# Patient Record
Sex: Female | Born: 1975 | Race: Black or African American | Hispanic: No | Marital: Single | Smoking: Never smoker
Health system: Southern US, Community
[De-identification: ages and names within clinical notes are randomized; demographics above are authoritative.]

## PROBLEM LIST (undated history)

## (undated) DIAGNOSIS — I10 Essential (primary) hypertension: Secondary | ICD-10-CM

## (undated) HISTORY — PX: TONSILLECTOMY: SUR1361

## (undated) HISTORY — PX: LUNG SURGERY: SHX703

---

## 1998-04-22 ENCOUNTER — Other Ambulatory Visit: Admission: RE | Admit: 1998-04-22 | Discharge: 1998-04-22 | Payer: Self-pay | Admitting: Obstetrics

## 2000-03-07 ENCOUNTER — Inpatient Hospital Stay (HOSPITAL_COMMUNITY): Admission: AD | Admit: 2000-03-07 | Discharge: 2000-03-07 | Payer: Self-pay | Admitting: *Deleted

## 2000-06-05 ENCOUNTER — Emergency Department (HOSPITAL_COMMUNITY): Admission: EM | Admit: 2000-06-05 | Discharge: 2000-06-05 | Payer: Self-pay | Admitting: Emergency Medicine

## 2001-08-12 ENCOUNTER — Other Ambulatory Visit: Admission: RE | Admit: 2001-08-12 | Discharge: 2001-08-12 | Payer: Self-pay

## 2002-12-15 ENCOUNTER — Emergency Department (HOSPITAL_COMMUNITY): Admission: EM | Admit: 2002-12-15 | Discharge: 2002-12-15 | Payer: Self-pay | Admitting: Emergency Medicine

## 2007-11-09 ENCOUNTER — Emergency Department (HOSPITAL_COMMUNITY): Admission: EM | Admit: 2007-11-09 | Discharge: 2007-11-09 | Payer: Self-pay | Admitting: Emergency Medicine

## 2010-09-20 ENCOUNTER — Inpatient Hospital Stay (HOSPITAL_COMMUNITY)
Admission: AD | Admit: 2010-09-20 | Discharge: 2010-09-20 | Payer: Self-pay | Source: Home / Self Care | Admitting: Obstetrics and Gynecology

## 2010-10-05 ENCOUNTER — Inpatient Hospital Stay (HOSPITAL_COMMUNITY)
Admission: AD | Admit: 2010-10-05 | Discharge: 2010-10-05 | Payer: Self-pay | Source: Home / Self Care | Attending: Obstetrics & Gynecology | Admitting: Obstetrics & Gynecology

## 2010-11-13 ENCOUNTER — Other Ambulatory Visit: Payer: Self-pay | Admitting: Obstetrics and Gynecology

## 2010-11-13 DIAGNOSIS — R928 Other abnormal and inconclusive findings on diagnostic imaging of breast: Secondary | ICD-10-CM

## 2010-11-27 ENCOUNTER — Ambulatory Visit
Admission: RE | Admit: 2010-11-27 | Discharge: 2010-11-27 | Disposition: A | Discharge status: Home / Self Care | Payer: Commercial Indemnity | Source: Ambulatory Visit | Attending: Obstetrics and Gynecology | Admitting: Obstetrics and Gynecology

## 2010-11-27 DIAGNOSIS — R928 Other abnormal and inconclusive findings on diagnostic imaging of breast: Secondary | ICD-10-CM

## 2010-12-25 LAB — CBC
HCT: 39.2 % (ref 36.0–46.0)
Hemoglobin: 13 g/dL (ref 12.0–15.0)
MCHC: 33.2 g/dL (ref 30.0–36.0)
RBC: 4.78 MIL/uL (ref 3.87–5.11)
WBC: 8.8 10*3/uL (ref 4.0–10.5)

## 2010-12-26 LAB — ABO/RH: ABO/RH(D): O POS

## 2010-12-26 LAB — CBC
Hemoglobin: 13.1 g/dL (ref 12.0–15.0)
Platelets: 283 10*3/uL (ref 150–400)
RBC: 4.61 MIL/uL (ref 3.87–5.11)
WBC: 6.4 10*3/uL (ref 4.0–10.5)

## 2010-12-26 LAB — URINALYSIS, ROUTINE W REFLEX MICROSCOPIC
Glucose, UA: NEGATIVE mg/dL
Hgb urine dipstick: NEGATIVE
Specific Gravity, Urine: 1.025 (ref 1.005–1.030)
pH: 6.5 (ref 5.0–8.0)

## 2010-12-26 LAB — POCT PREGNANCY, URINE: Preg Test, Ur: POSITIVE

## 2010-12-26 LAB — HCG, QUANTITATIVE, PREGNANCY: hCG, Beta Chain, Quant, S: 16657 m[IU]/mL — ABNORMAL HIGH (ref <5)

## 2011-01-09 ENCOUNTER — Inpatient Hospital Stay (HOSPITAL_COMMUNITY)
Admission: AD | Admit: 2011-01-09 | Discharge: 2011-01-10 | Disposition: A | Discharge status: Home / Self Care | Payer: Self-pay | Source: Ambulatory Visit | Attending: Obstetrics & Gynecology | Admitting: Obstetrics & Gynecology

## 2011-01-09 DIAGNOSIS — O200 Threatened abortion: Secondary | ICD-10-CM | POA: Insufficient documentation

## 2011-01-10 ENCOUNTER — Inpatient Hospital Stay (HOSPITAL_COMMUNITY): Payer: Self-pay

## 2011-01-10 LAB — WET PREP, GENITAL: Trich, Wet Prep: NONE SEEN

## 2011-01-10 LAB — ABO/RH: ABO/RH(D): O POS

## 2011-01-10 LAB — CBC
MCH: 27.4 pg (ref 26.0–34.0)
MCV: 82.9 fL (ref 78.0–100.0)
Platelets: 273 10*3/uL (ref 150–400)
RBC: 4.75 MIL/uL (ref 3.87–5.11)
RDW: 13.7 % (ref 11.5–15.5)
WBC: 9.9 10*3/uL (ref 4.0–10.5)

## 2011-01-10 LAB — POCT PREGNANCY, URINE: Preg Test, Ur: POSITIVE

## 2011-01-10 LAB — URINALYSIS, ROUTINE W REFLEX MICROSCOPIC
Hgb urine dipstick: NEGATIVE
Nitrite: NEGATIVE
Protein, ur: NEGATIVE mg/dL
Specific Gravity, Urine: 1.01 (ref 1.005–1.030)
Urobilinogen, UA: 0.2 mg/dL (ref 0.0–1.0)

## 2011-01-10 LAB — GC/CHLAMYDIA PROBE AMP, GENITAL
Chlamydia, DNA Probe: NEGATIVE
GC Probe Amp, Genital: NEGATIVE

## 2012-01-02 ENCOUNTER — Encounter (HOSPITAL_BASED_OUTPATIENT_CLINIC_OR_DEPARTMENT_OTHER): Payer: Self-pay | Admitting: Student

## 2012-01-02 ENCOUNTER — Emergency Department (INDEPENDENT_AMBULATORY_CARE_PROVIDER_SITE_OTHER): Payer: Self-pay

## 2012-01-02 ENCOUNTER — Emergency Department (HOSPITAL_BASED_OUTPATIENT_CLINIC_OR_DEPARTMENT_OTHER)
Admission: EM | Admit: 2012-01-02 | Discharge: 2012-01-02 | Disposition: A | Discharge status: Home / Self Care | Payer: Self-pay | Attending: Emergency Medicine | Admitting: Emergency Medicine

## 2012-01-02 DIAGNOSIS — R51 Headache: Secondary | ICD-10-CM

## 2012-01-02 DIAGNOSIS — S0990XA Unspecified injury of head, initial encounter: Secondary | ICD-10-CM

## 2012-01-02 DIAGNOSIS — W2209XA Striking against other stationary object, initial encounter: Secondary | ICD-10-CM | POA: Insufficient documentation

## 2012-01-02 DIAGNOSIS — Y93E9 Activity, other interior property and clothing maintenance: Secondary | ICD-10-CM | POA: Insufficient documentation

## 2012-01-02 DIAGNOSIS — S161XXA Strain of muscle, fascia and tendon at neck level, initial encounter: Secondary | ICD-10-CM

## 2012-01-02 DIAGNOSIS — I10 Essential (primary) hypertension: Secondary | ICD-10-CM | POA: Insufficient documentation

## 2012-01-02 DIAGNOSIS — S139XXA Sprain of joints and ligaments of unspecified parts of neck, initial encounter: Secondary | ICD-10-CM | POA: Insufficient documentation

## 2012-01-02 DIAGNOSIS — Y92009 Unspecified place in unspecified non-institutional (private) residence as the place of occurrence of the external cause: Secondary | ICD-10-CM | POA: Insufficient documentation

## 2012-01-02 DIAGNOSIS — M542 Cervicalgia: Secondary | ICD-10-CM

## 2012-01-02 HISTORY — DX: Essential (primary) hypertension: I10

## 2012-01-02 MED ORDER — CYCLOBENZAPRINE HCL 10 MG PO TABS: 10.0000 mg | ORAL_TABLET | Freq: Two times a day (BID) | ORAL | Status: AC | PRN

## 2012-01-02 NOTE — Discharge Instructions (Signed)
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
## 2012-01-02 NOTE — ED Notes (Signed)
Pt in with c/o neck pain and headache s/p hitting top of head inside of fireplace on Sunday.

## 2012-04-20 IMAGING — US US OB COMP LESS 14 WK
1 series · 14 of 23 positions shown · non-contrast
Comparison: None of this pregnancy

CLINICAL DATA: Pregnant, abdominal pain, bleeding, passed clots; no
quantitative beta HCG for correlation

OBSTETRIC <14 WK ULTRASOUND
TECHNIQUE: Transabdominal ultrasound was performed for evaluation
of the gestation as well as the maternal uterus and adnexal
regions.

[Series 1: us ob comp less 14 wks · 14 of 23 slices shown]
[im 1/23]
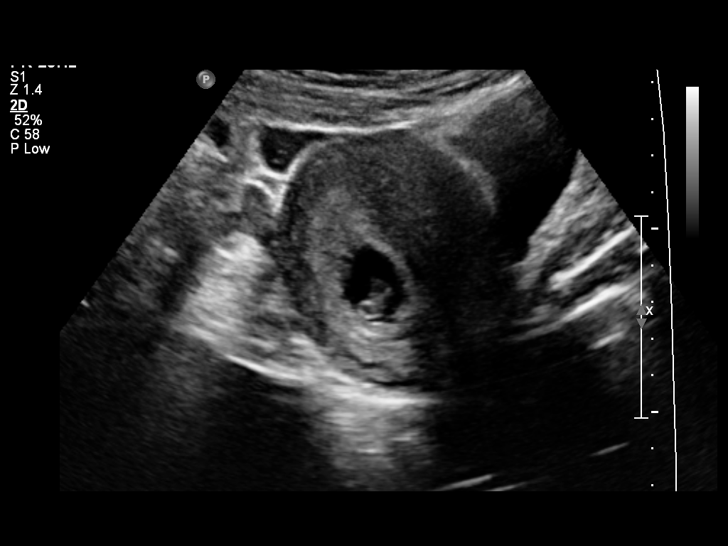
[im 3/23]
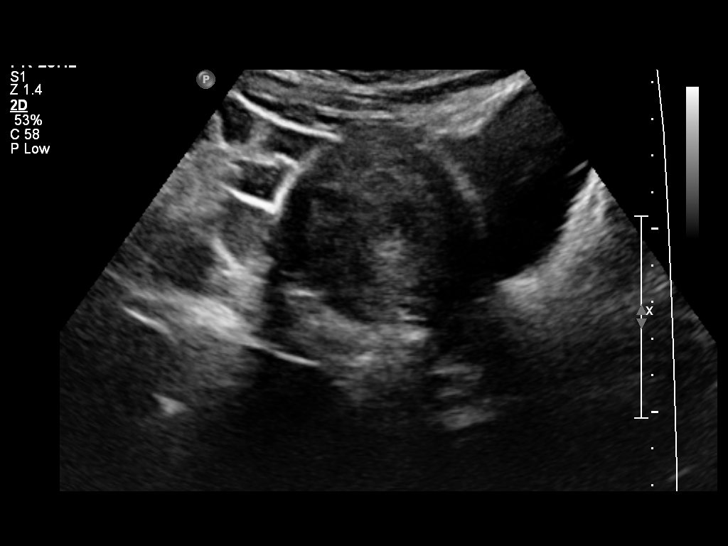
[im 5/23]
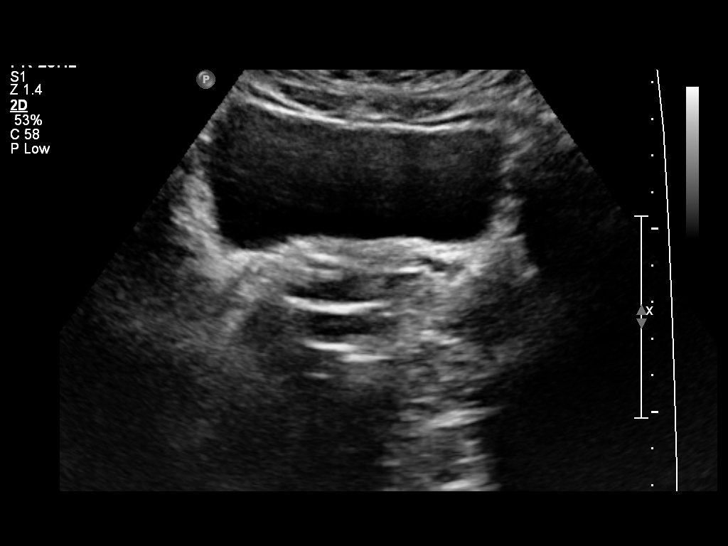
[im 6/23]
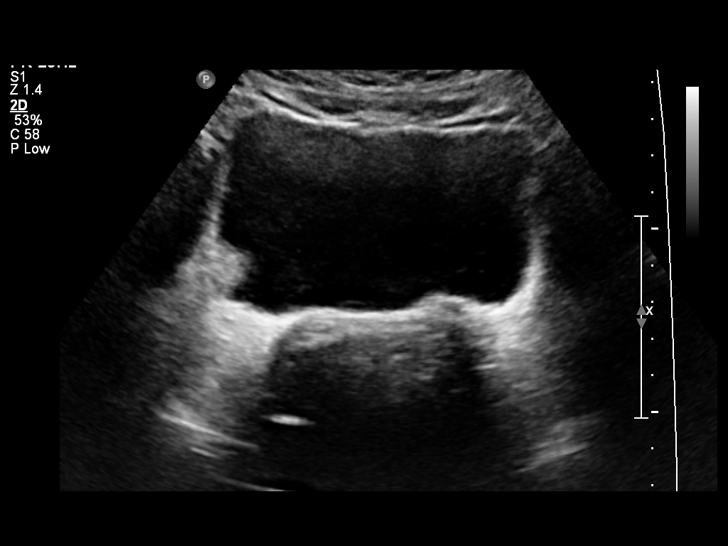
[im 8/23]
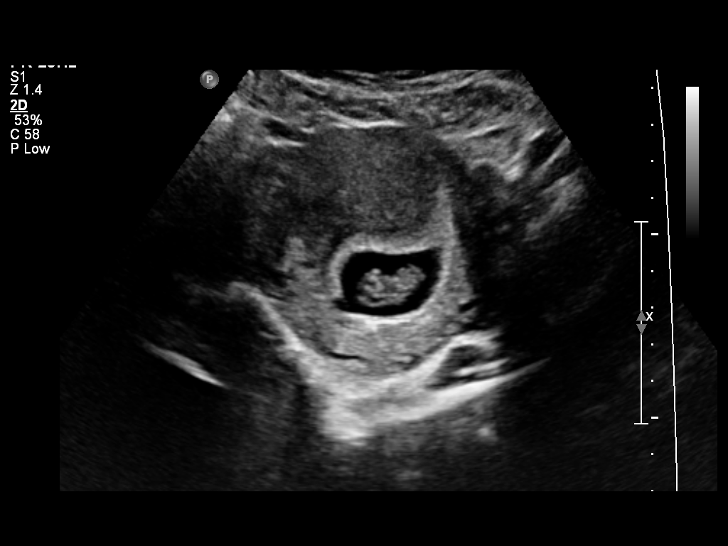
[im 10/23]
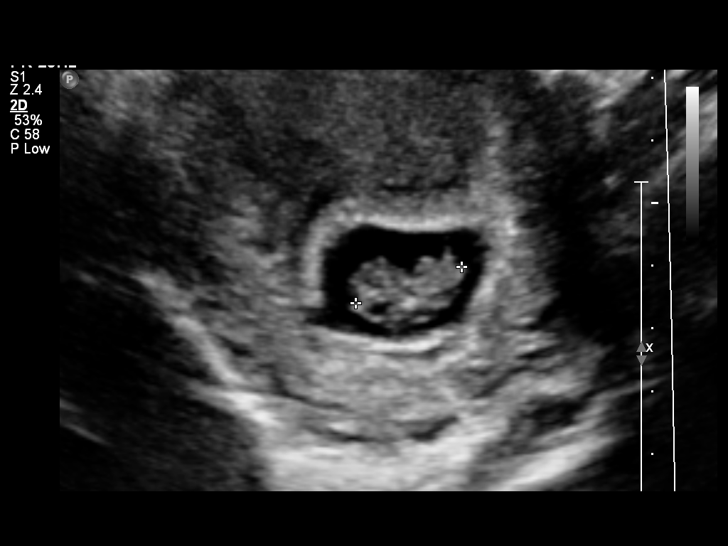
[im 11/23]
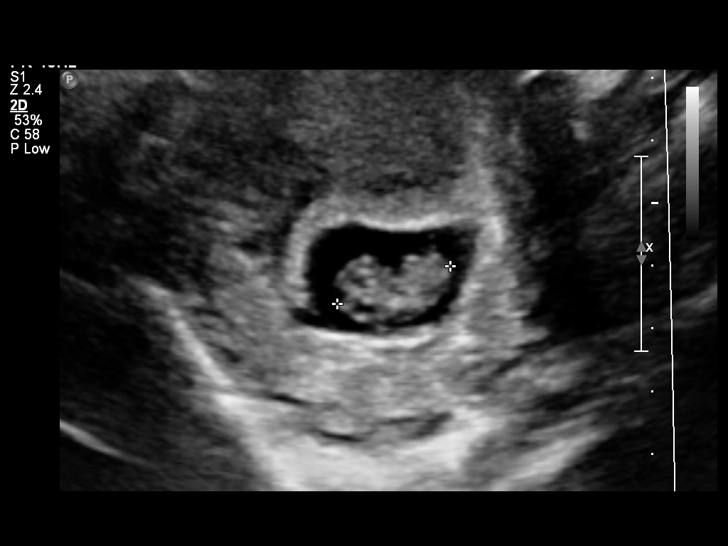
[im 13/23]
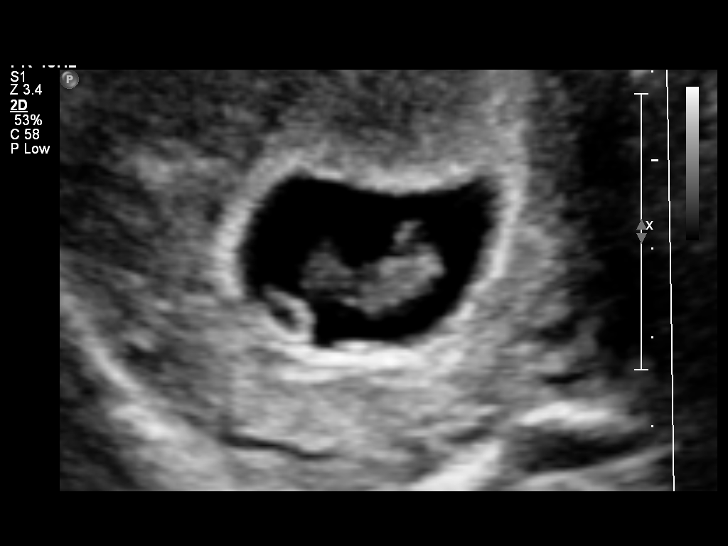
[im 14/23]
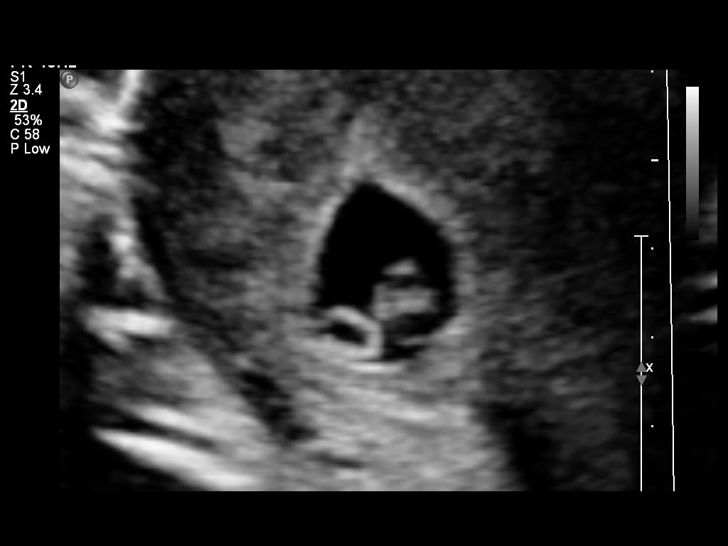
[im 16/23]
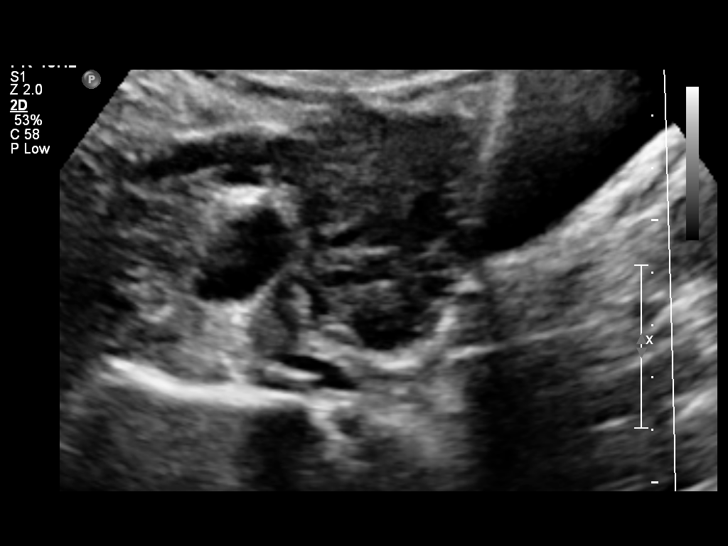
[im 18/23]
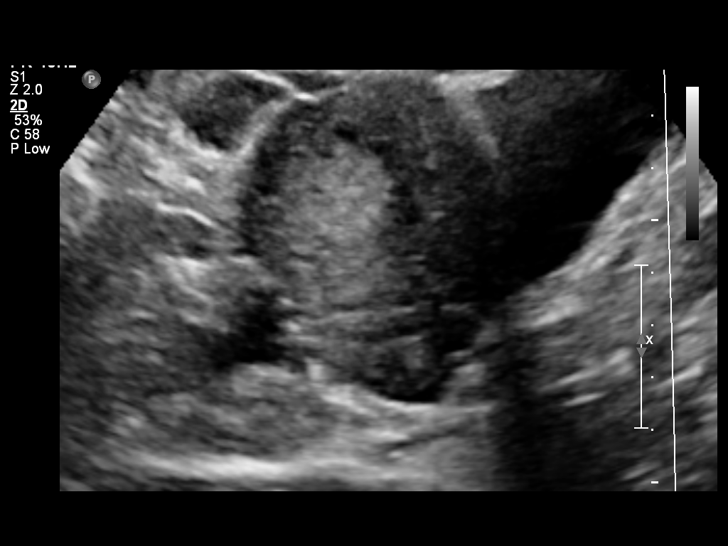
[im 19/23]
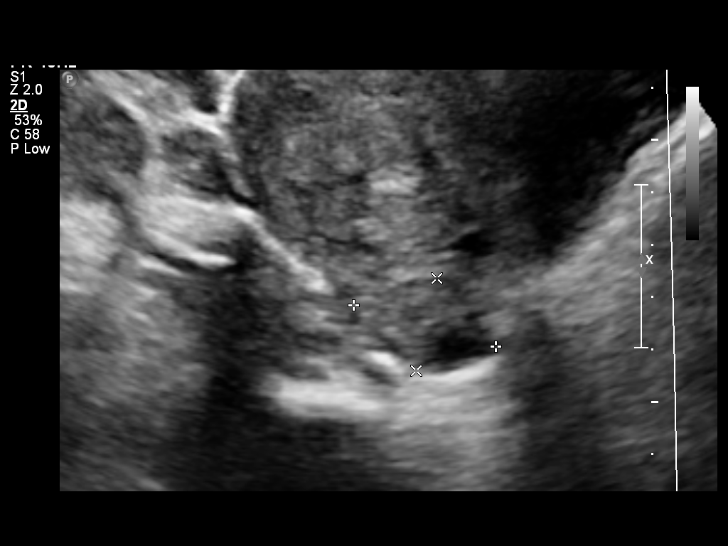
[im 21/23]
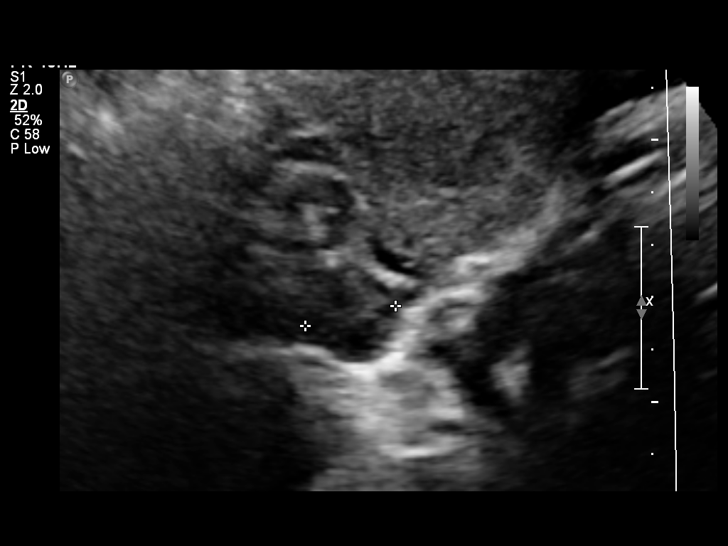
[im 23/23]
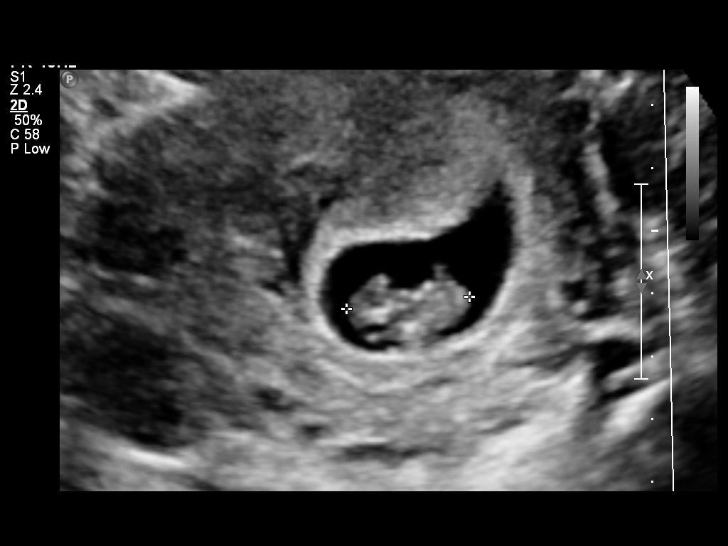

[14 of 23 positions shown; findings below may reference images not displayed]

Intrauterine gestational sac: Visualized/normal in shape.
Yolk sac: Present
Embryo: Present
Cardiac Activity: Present
Heart Rate: 176 bpm

CRL:  18.3 mm    8 w  3 d           US EDC: 08/19/2011

Maternal uterus/Adnexae:
No subchorionic hemorrhage.
Right ovary normal size and morphology, 2.8 x 1.8 x 1.8 cm.
Left ovary normal size and morphology, 2.5 x 1.5 x 1.6 cm.
No adnexal masses or free pelvic fluid.
IMPRESSION: Single live early intrauterine gestation measured at 8 weeks 3 days
EGA by crown-rump length.
No acute abnormalities.

## 2012-11-23 ENCOUNTER — Emergency Department (HOSPITAL_BASED_OUTPATIENT_CLINIC_OR_DEPARTMENT_OTHER)
Admission: EM | Admit: 2012-11-23 | Discharge: 2012-11-23 | Disposition: A | Discharge status: Home / Self Care | Payer: Self-pay | Attending: Emergency Medicine | Admitting: Emergency Medicine

## 2012-11-23 ENCOUNTER — Encounter (HOSPITAL_BASED_OUTPATIENT_CLINIC_OR_DEPARTMENT_OTHER): Payer: Self-pay | Admitting: *Deleted

## 2012-11-23 DIAGNOSIS — K029 Dental caries, unspecified: Secondary | ICD-10-CM | POA: Insufficient documentation

## 2012-11-23 DIAGNOSIS — Z79899 Other long term (current) drug therapy: Secondary | ICD-10-CM | POA: Insufficient documentation

## 2012-11-23 DIAGNOSIS — I10 Essential (primary) hypertension: Secondary | ICD-10-CM | POA: Insufficient documentation

## 2012-11-23 MED ORDER — OXYCODONE-ACETAMINOPHEN 5-325 MG PO TABS: 2.0000 | ORAL_TABLET | ORAL | Status: AC | PRN

## 2012-11-23 MED ORDER — AMOXICILLIN 500 MG PO CAPS: 500.0000 mg | ORAL_CAPSULE | Freq: Two times a day (BID) | ORAL | Status: AC

## 2012-11-23 NOTE — ED Notes (Signed)
Pain to tooth (bottom left) since yesterday

## 2012-11-23 NOTE — ED Provider Notes (Signed)
History  This chart was scribed for Richardean Canal, MD by Shari Heritage, ED Scribe. The patient was seen in room [facility]2/[facility]2. Patient's care was started at 1923.   CSN: [id number]  Arrival date & time 11/23/12  4098   First MD Initiated Contact with Patient 11/23/12 1923      Chief Complaint  Patient presents with  . Dental Pain     The history is provided by the patient. No language interpreter was used.     HPI Comments: Sylvia Carlson is a 37 y.o. female who presents to the Emergency Department complaining of sudden, worsening, moderate, constant left lower dental pain onset yesterday. Patient states that there is a "hole" in the affected tooth that has been present for a couple of months, but it is getting worse. She denies fever, gum swelling or any other symptoms at this time. Patient does not have a dentist and has not been seen for this problem. Patient denies possibility of pregnancy. She has a medical history of HTN.   Past Medical History  Diagnosis Date  . Hypertension     Past Surgical History  Procedure Laterality Date  . Lung surgery      Cyst removal both lungs    History reviewed. No pertinent family history.  History  Substance Use Topics  . Smoking status: Never Smoker   . Smokeless tobacco: Not on file  . Alcohol Use: Yes    OB History   Grav Para Term Preterm Abortions TAB SAB Ect Mult Living                  Review of Systems  Constitutional: Negative for fever.  HENT: Positive for dental problem.   All other systems reviewed and are negative.    Allergies  Antihistamines, diphenhydramine-type and Nyquil  Home Medications   Current Outpatient Rx  Name  Route  Sig  Dispense  Refill  . amoxicillin (AMOXIL) 500 MG capsule   Oral   Take 1 capsule (500 mg total) by mouth 2 (two) times daily.   15 capsule   0   . ibuprofen (ADVIL,MOTRIN) 200 MG tablet   Oral   Take 600 mg by mouth every 6 (six) hours as needed. Patient used this  medication for pain.         Marland Kitchen lisinopril-hydrochlorothiazide (PRINZIDE,ZESTORETIC) 20-25 MG per tablet   Oral   Take 1 tablet by mouth daily.         Marland Kitchen oxyCODONE-acetaminophen (PERCOCET) 5-325 MG per tablet   Oral   Take 2 tablets by mouth every 4 (four) hours as needed for pain.   10 tablet   0     Triage Vitals: BP 176/124  Pulse 16  Temp(Src) 98.2 F (36.8 C) (Oral)  Resp 16  Ht 5\' 1"  (1.549 m)  Wt 152 lb (68.947 kg)  BMI 28.74 kg/m2  SpO2 100%  LMP 11/21/2012  Physical Exam  Constitutional: She is oriented to person, place, and time. She appears well-developed and well-nourished.  HENT:  Head: Normocephalic and atraumatic.  Mouth/Throat: Dental caries present.    Eyes: Pupils are equal, round, and reactive to light.  Cardiovascular: Normal rate.   Pulmonary/Chest: Effort normal.  Musculoskeletal: Normal range of motion.  Neurological: She is alert and oriented to person, place, and time.  Skin: Skin is warm and dry.    ED Course  Procedures (including critical care time) DIAGNOSTIC STUDIES: Oxygen Saturation is 100% on room air, normal by my  interpretation.    COORDINATION OF CARE: 7:32 PM- Patient informed of current plan for treatment and evaluation and agrees with plan at this time.      Labs Reviewed - No data to display No results found.   1. Dental caries       MDM  Sylvia Carlson is a 37 y.o. female here with dental pain with an obvious large dental caries. No obvious gum swelling or abscess formation. She was given amoxicillin, percocet, and dental f/u.     I personally performed the services described in this documentation, which was scribed in my presence. The recorded information has been reviewed and is accurate.   Richardean Canal, MD 11/24/12 [phone]

## 2012-11-24 ENCOUNTER — Telehealth (HOSPITAL_COMMUNITY): Payer: Self-pay | Admitting: Emergency Medicine

## 2013-04-12 IMAGING — CT CT HEAD W/O CM
4 of 5 series · 16 of 47 positions shown, 17 images · non-contrast
Comparison: None

CT HEAD

CLINICAL DATA: Trauma 2 days ago with headache and neck pain

CT HEAD WITHOUT CONTRAST
CT CERVICAL SPINE WITHOUT CONTRAST
TECHNIQUE: Multidetector CT imaging of the head and cervical spine
was performed following the standard protocol without intravenous
contrast.  Multiplanar CT image reconstructions of the cervical
spine were also generated.

[Series 2: head 4.8 h37s · axial · 0.45mm/px · z∈[-139,-63]mm · 3 of 32 slices shown, 4 images]
[im 8/32  brain]
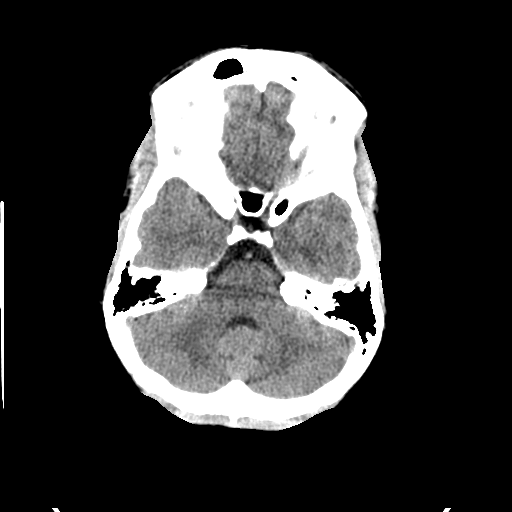
[im 8/32  bone]
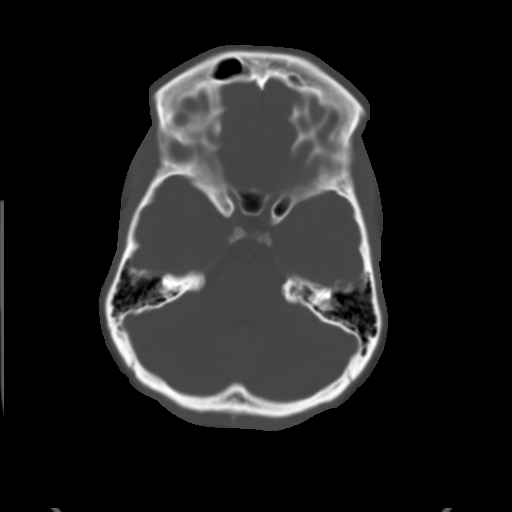
[im 16/32  brain]
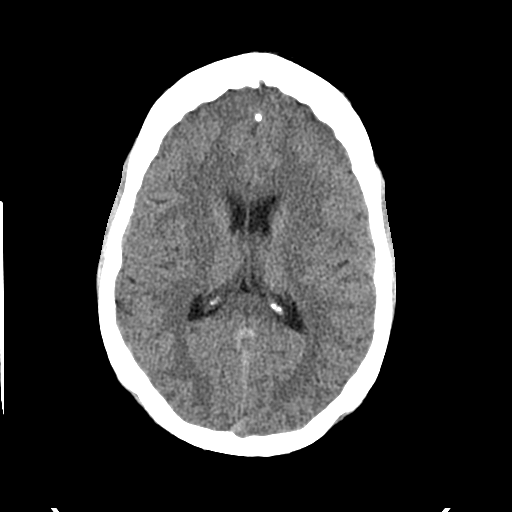
[im 24/32  brain]
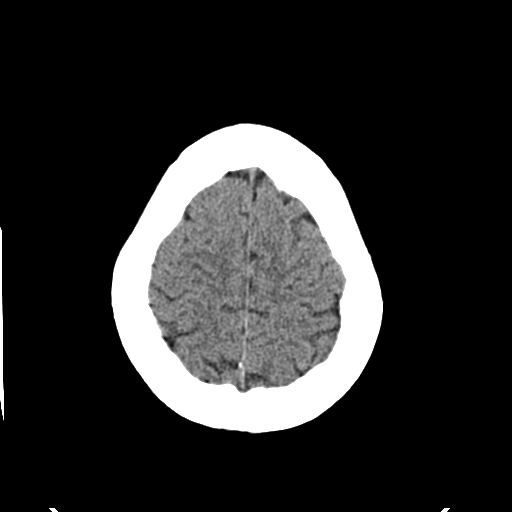

[Series 8: c_spine 2.0 coronal · coronal · 0.26mm/px · 3 of 58 slices shown]
[im 20/58  brain]
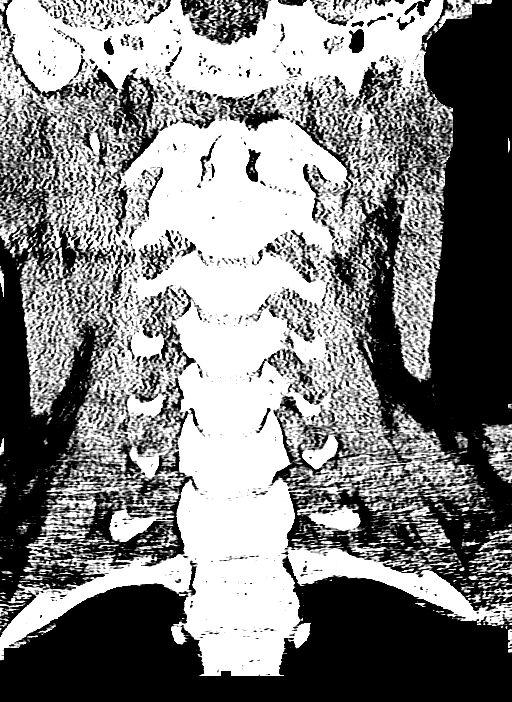
[im 26/58  brain]
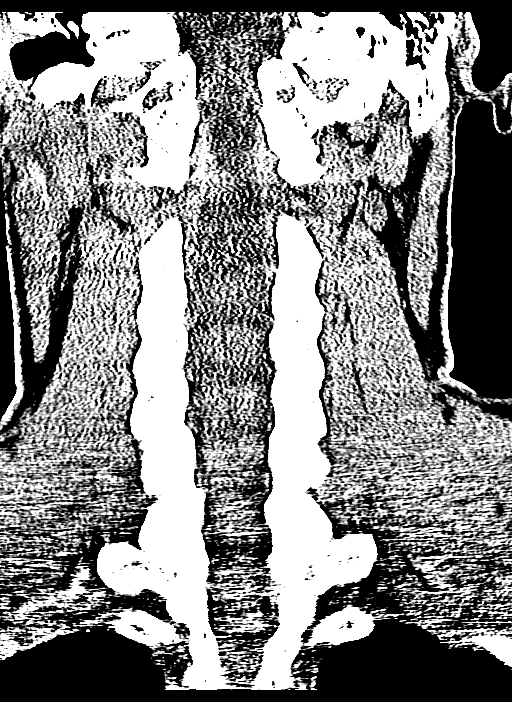
[im 32/58  brain]
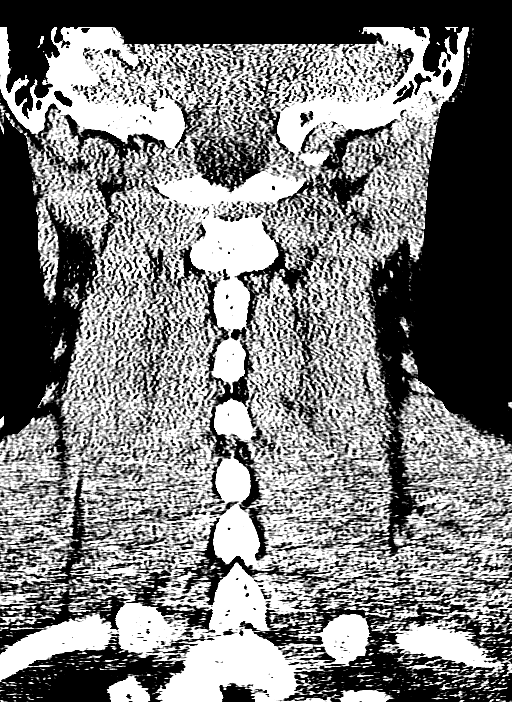

[Series 9: c_spine 2.0 sagittal · sagittal · 0.28mm/px · 3 of 68 slices shown]
[im 23/68  brain]
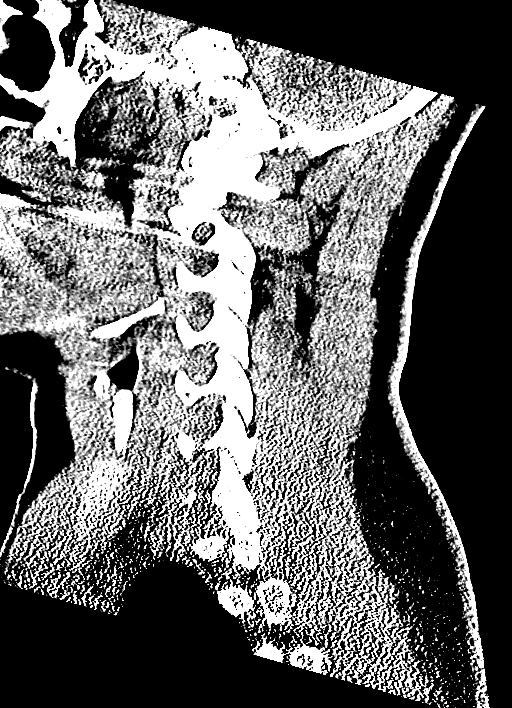
[im 34/68  brain]
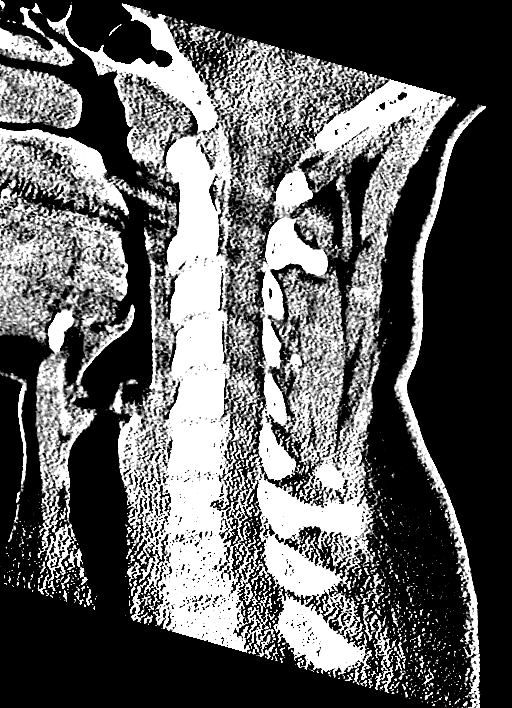
[im 45/68  brain]
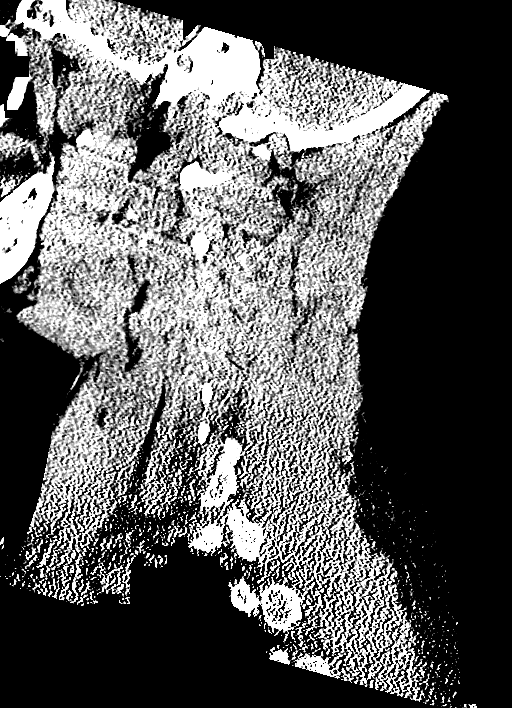

[Series 10: c_spine 2.0 orth ax · axial · 0.31mm/px · z∈[-345,-236]mm · 7 of 87 slices shown]
[im 8/87  brain]
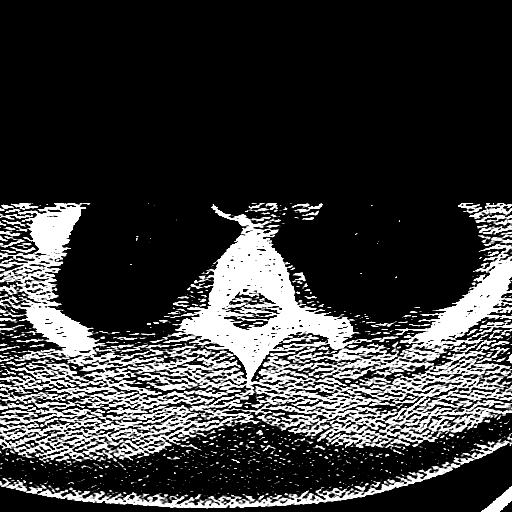
[im 22/87  brain]
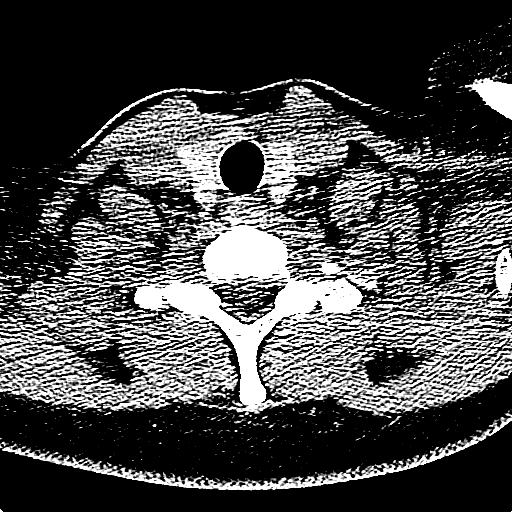
[im 29/87  brain]
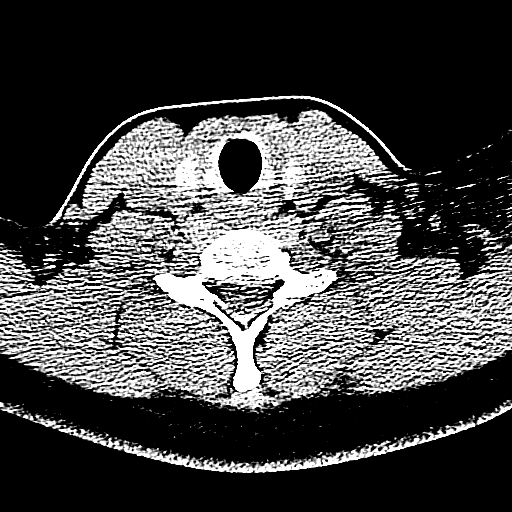
[im 36/87  brain]
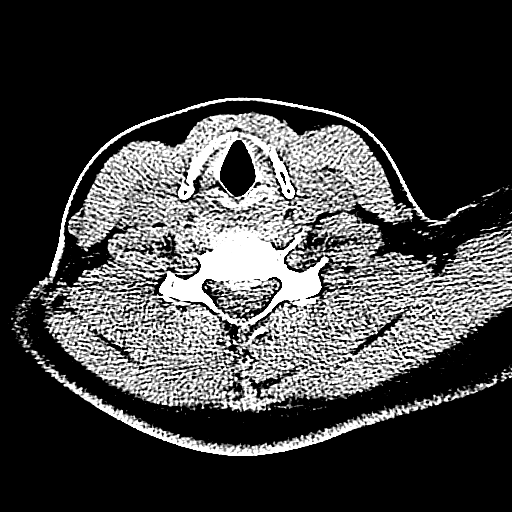
[im 51/87  brain]
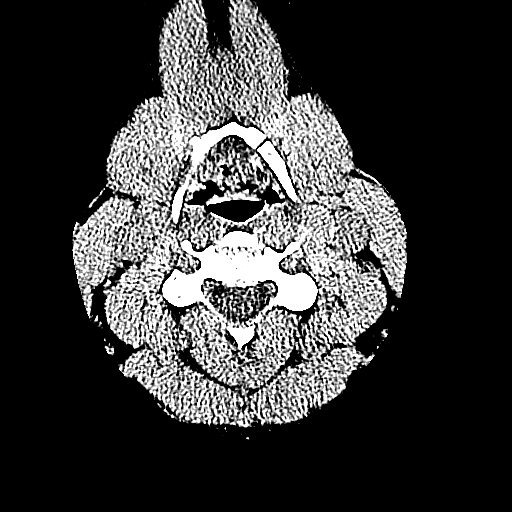
[im 58/87  brain]
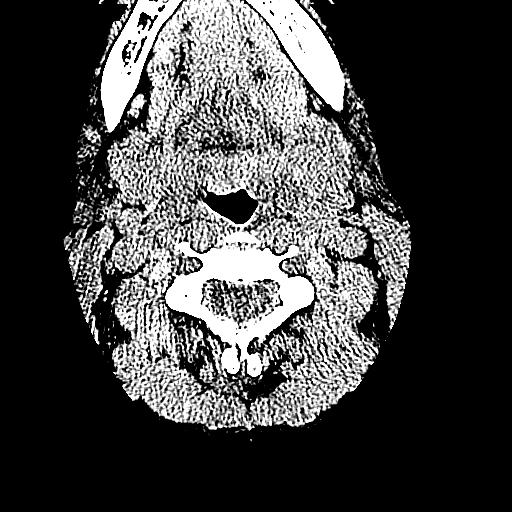
[im 65/87  brain]
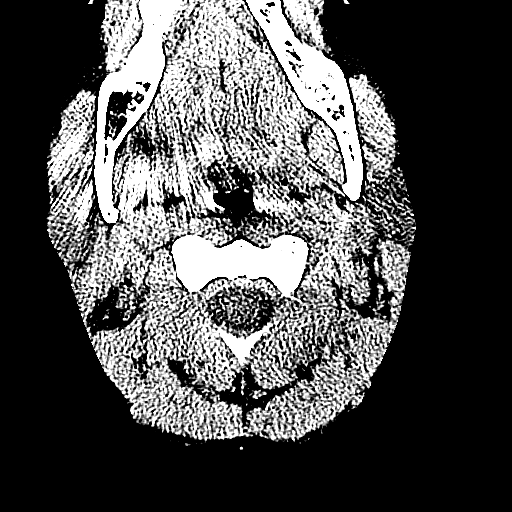

[16 of 47 positions shown; findings below may reference images not displayed]

FINDINGS: The brain has a normal appearance without evidence of
atrophy, old or acute infarction, intra-axial mass lesion,
hemorrhage, hydrocephalus or extra-axial collection.  There is a
congenital midline lipoma in the peri masses cephalic cistern, also
associated with the dense calcification.  No skull fracture.  No
fluid in the sinuses, middle ears or mastoids.
IMPRESSION: No acute or traumatic finding.  Lipoma in the perimesencephalic
cistern region, a congenital and probably not of clinical
relevance.  Dense dural calcification adjacent to that.

CT CERVICAL SPINE
FINDINGS: Alignment is normal.  No fracture.  No soft tissue
swelling.  No degenerative changes or other focal lesions.
IMPRESSION: Normal CT scan of the cervical spine.

## 2013-10-22 ENCOUNTER — Other Ambulatory Visit: Payer: Self-pay | Admitting: Physician Assistant

## 2013-10-22 ENCOUNTER — Other Ambulatory Visit (HOSPITAL_COMMUNITY)
Admission: RE | Admit: 2013-10-22 | Discharge: 2013-10-22 | Disposition: A | Discharge status: Home / Self Care | Payer: BC Managed Care – PPO | Source: Ambulatory Visit | Attending: Family Medicine | Admitting: Family Medicine

## 2013-10-22 DIAGNOSIS — Z124 Encounter for screening for malignant neoplasm of cervix: Secondary | ICD-10-CM | POA: Insufficient documentation

## 2013-11-03 ENCOUNTER — Emergency Department (HOSPITAL_BASED_OUTPATIENT_CLINIC_OR_DEPARTMENT_OTHER)
Admission: EM | Admit: 2013-11-03 | Discharge: 2013-11-04 | Disposition: A | Discharge status: Home / Self Care | Payer: BC Managed Care – PPO | Attending: Emergency Medicine | Admitting: Emergency Medicine

## 2013-11-03 ENCOUNTER — Encounter (HOSPITAL_BASED_OUTPATIENT_CLINIC_OR_DEPARTMENT_OTHER): Payer: Self-pay | Admitting: Emergency Medicine

## 2013-11-03 DIAGNOSIS — R55 Syncope and collapse: Secondary | ICD-10-CM | POA: Insufficient documentation

## 2013-11-03 DIAGNOSIS — Z792 Long term (current) use of antibiotics: Secondary | ICD-10-CM | POA: Insufficient documentation

## 2013-11-03 DIAGNOSIS — Z3202 Encounter for pregnancy test, result negative: Secondary | ICD-10-CM | POA: Insufficient documentation

## 2013-11-03 DIAGNOSIS — Z79899 Other long term (current) drug therapy: Secondary | ICD-10-CM | POA: Insufficient documentation

## 2013-11-03 DIAGNOSIS — R61 Generalized hyperhidrosis: Secondary | ICD-10-CM | POA: Insufficient documentation

## 2013-11-03 DIAGNOSIS — I10 Essential (primary) hypertension: Secondary | ICD-10-CM | POA: Insufficient documentation

## 2013-11-03 LAB — URINALYSIS, ROUTINE W REFLEX MICROSCOPIC
Bilirubin Urine: NEGATIVE
Glucose, UA: NEGATIVE mg/dL
HGB URINE DIPSTICK: NEGATIVE
Ketones, ur: NEGATIVE mg/dL
LEUKOCYTES UA: NEGATIVE
NITRITE: NEGATIVE
Protein, ur: NEGATIVE mg/dL
SPECIFIC GRAVITY, URINE: 1.024 (ref 1.005–1.030)
UROBILINOGEN UA: 0.2 mg/dL (ref 0.0–1.0)
pH: 6 (ref 5.0–8.0)

## 2013-11-03 LAB — PREGNANCY, URINE: PREG TEST UR: NEGATIVE

## 2013-11-03 LAB — GLUCOSE, CAPILLARY: GLUCOSE-CAPILLARY: 114 mg/dL — AB (ref 70–99)

## 2013-11-03 NOTE — ED Notes (Signed)
Pt was cooking supper when she had a near syncopal event, denies CP or SOB. Pt states she was very hungry and had not eaten since 11am. Pt states she fell back and hit her head on the floor, alert and oriented x 4 at this time. Pt ambulated with a steady gait and states she was able to eat dinner after the episode.

## 2013-11-03 NOTE — ED Provider Notes (Addendum)
CSN: [id number]     Arrival date & time 11/03/13  2231 History  This chart was scribed for [name], MD by Dorothey Basemania Sutton, ED Scribe. This patient was seen in room MH10/MH10 and the patient's care was started at 11:05 PM.    Chief Complaint  Patient presents with  . Near Syncope   The history is provided by the patient. No language interpreter was used.   HPI Comments: Sylvia Carlson is a 38 y.o. female who presents to the Emergency Department complaining of two brief syncopal episodes, each lasting a few seconds, that occurred around 2 hours ago. Patient reports that she was cooking dinner and began feeling very warm and became somewhat diaphoretic just prior to the first episode. She states that the second episode occurred almost immediately after when her roommate tried to help her up. Patient states that she felt a bit better after eating dinner and drinking some water. Patient reports that she has experienced similar symptoms before when she was on an oral contraceptive, but that she was taken off of that medication several months ago. She denies confusion, chest pain, shortness of breath, nausea, leg swelling. She denies any bloody stools, dark stools. She denies any recent extended travel, surgeries, hormone use, or drug abuse. Patient denies history of cancer or DVT/PE. She denies familial history of cardiac problems. Pt has hnot had similar symptoms since that initial event around supper time.  Past Medical History  Diagnosis Date  . Hypertension    Past Surgical History  Procedure Laterality Date  . Lung surgery      Cyst removal both lungs  . Tonsillectomy     History reviewed. No pertinent family history. History  Substance Use Topics  . Smoking status: Never Smoker   . Smokeless tobacco: Not on file  . Alcohol Use: Yes   OB History   Grav Para Term Preterm Abortions TAB SAB Ect Mult Living                 Review of Systems  Constitutional: Positive for  diaphoresis.  Respiratory: Negative for shortness of breath.   Cardiovascular: Negative for chest pain and leg swelling.  Gastrointestinal: Negative for nausea.  Neurological: Positive for syncope.  Psychiatric/Behavioral: Negative for confusion.    Allergies  Antihistamines, diphenhydramine-type and Nyquil  Home Medications   Current Outpatient Rx  Name  Route  Sig  Dispense  Refill  . lisinopril-hydrochlorothiazide (PRINZIDE,ZESTORETIC) 20-25 MG per tablet   Oral   Take 1 tablet by mouth daily.         Marland Kitchen. amoxicillin (AMOXIL) 500 MG capsule   Oral   Take 1 capsule (500 mg total) by mouth 2 (two) times daily.   15 capsule   0   . ibuprofen (ADVIL,MOTRIN) 200 MG tablet   Oral   Take 600 mg by mouth every 6 (six) hours as needed. Patient used this medication for pain.         Marland Kitchen. oxyCODONE-acetaminophen (PERCOCET) 5-325 MG per tablet   Oral   Take 2 tablets by mouth every 4 (four) hours as needed for pain.   10 tablet   0    Triage Vitals: BP 132/80  Pulse 96  Temp(Src) 98.4 F (36.9 C) (Oral)  Resp 18  Ht 5\' 2"  (1.575 m)  Wt 155 lb (70.308 kg)  BMI 28.34 kg/m2  SpO2 100%  LMP 10/15/2013  Physical Exam  Nursing note and vitals reviewed. Constitutional: She is oriented to person,  place, and time. She appears well-developed and well-nourished. No distress.  HENT:  Head: Normocephalic and atraumatic.  Eyes: Conjunctivae are normal.  Neck: Normal range of motion. Neck supple. No JVD present.  Cardiovascular: Normal rate, regular rhythm and normal heart sounds.   No murmur heard. Pulmonary/Chest: Effort normal and breath sounds normal. No respiratory distress.  Lungs are clear to auscultation bilaterally.   Abdominal: Soft. She exhibits no distension and no mass. There is no tenderness.  Musculoskeletal: Normal range of motion. She exhibits no edema.  No calf swelling.   Neurological: She is alert and oriented to person, place, and time.  Skin: Skin is warm  and dry.  Psychiatric: She has a normal mood and affect. Her behavior is normal.    ED Course  Procedures (including critical care time)  DIAGNOSTIC STUDIES: Oxygen Saturation is 100% on room air, normal by my interpretation.    COORDINATION OF CARE: 11:10 PM- Ordered UA and an EKG. Will order blood labs. Will put patient on a cardiac monitor for observation. Discussed treatment plan with patient at bedside and patient verbalized agreement.     Labs Review Labs Reviewed  URINALYSIS, ROUTINE W REFLEX MICROSCOPIC - Abnormal; Notable for the following:    APPearance CLOUDY (*)    All other components within normal limits  GLUCOSE, CAPILLARY - Abnormal; Notable for the following:    Glucose-Capillary 114 (*)    All other components within normal limits  PREGNANCY, URINE   Imaging Review No results found.  EKG Interpretation    Date/Time:  Tuesday November 03 2013 22:50:50 EST Ventricular Rate:  87 PR Interval:  168 QRS Duration: 86 QT Interval:  348 QTC Calculation: 418 R Axis:   56 Text Interpretation:  Normal sinus rhythm Cannot rule out Anterior infarct , age undetermined Abnormal ECG Confirmed by DELOS  MD, DOUGLAS ([id number]) on 11/03/2013 11:20:47 PM            MDM  No diagnosis found.  I personally performed the services described in this documentation, which was scribed in my presence. The recorded information has been reviewed and is accurate.  Pt comes in with cc of syncope.  DDx includes: Orthostatic hypotension Stroke Vertebral artery dissection/stenosis Dysrhythmia PE Vasovagal/neurocardiogenic syncope Aortic stenosis Valvular disorder/Cardiomyopathy Anemia  Pt has no CAD, CHF hx, she had no cardiac prodrome prior to the syncope. Additionally, she has no hx of PE, DVT, and no risk factors for the same. Pt had some "hot" feeling, and she got diaphoretic prior to this episode - which seems more consitent with an vasovagal type syncope. It is  reassuring that patient personally doesn't have any Cardiac hx, or risk factors, and her family hx of benign as well.  Patient also denies any bleeding. We will get continuous cardiac monitoring whilst patient is in the ED and get basic labs. EKG appears reassuring.    Derwood Kaplan, MD 11/04/13 8119  Derwood Kaplan, MD 11/04/13 0111

## 2013-11-03 NOTE — ED Notes (Addendum)
States passed out x 2 within 3 minutes,  Pt states feels fine now except for being tired  A&O,  States has some rt hand pain after falling  No deformity noted, slight swelling at base of thumb,  Good movement

## 2013-11-04 MED ORDER — METHOCARBAMOL 500 MG PO TABS: 500.0000 mg | ORAL_TABLET | Freq: Two times a day (BID) | ORAL | Status: AC

## 2013-11-04 MED ORDER — DIAZEPAM 2 MG PO TABS
2.0000 mg | ORAL_TABLET | Freq: Once | ORAL | Status: AC
  Administered 2013-11-04: 2 mg via ORAL
  Filled 2013-11-04: qty 1

## 2013-11-04 NOTE — Discharge Instructions (Signed)
Syncope  Syncope is a fainting spell. This means the person loses consciousness and drops to the ground. The person is generally unconscious for less than 5 minutes. The person may have some muscle twitches for up to 15 seconds before waking up and returning to normal. Syncope occurs more often in elderly people, but it can happen to anyone. While most causes of syncope are not dangerous, syncope can be a sign of a serious medical problem. It is important to seek medical care.   CAUSES   Syncope is caused by a sudden decrease in blood flow to the brain. The specific cause is often not determined. Factors that can trigger syncope include:   Taking medicines that lower blood pressure.   Sudden changes in posture, such as standing up suddenly.   Taking more medicine than prescribed.   Standing in one place for too long.   Seizure disorders.   Dehydration and excessive exposure to heat.   Low blood sugar (hypoglycemia).   Straining to have a bowel movement.   Heart disease, irregular heartbeat, or other circulatory problems.   Fear, emotional distress, seeing blood, or severe pain.  SYMPTOMS   Right before fainting, you may:   Feel dizzy or lightheaded.   Feel nauseous.   See all white or all black in your field of vision.   Have cold, clammy skin.  DIAGNOSIS   Your caregiver will ask about your symptoms, perform a physical exam, and perform electrocardiography (ECG) to record the electrical activity of your heart. Your caregiver may also perform other heart or blood tests to determine the cause of your syncope.  TREATMENT   In most cases, no treatment is needed. Depending on the cause of your syncope, your caregiver may recommend changing or stopping some of your medicines.  HOME CARE INSTRUCTIONS   Have someone stay with you until you feel stable.   Do not drive, operate machinery, or play sports until your caregiver says it is okay.   Keep all follow-up appointments as directed by your  caregiver.   Lie down right away if you start feeling like you might faint. Breathe deeply and steadily. Wait until all the symptoms have passed.   Drink enough fluids to keep your urine clear or pale yellow.   If you are taking blood pressure or heart medicine, get up slowly, taking several minutes to sit and then stand. This can reduce dizziness.  SEEK IMMEDIATE MEDICAL CARE IF:    You have a severe headache.   You have unusual pain in the chest, abdomen, or back.   You are bleeding from the mouth or rectum, or you have black or tarry stool.   You have an irregular or very fast heartbeat.   You have pain with breathing.   You have repeated fainting or seizure-like jerking during an episode.   You faint when sitting or lying down.   You have confusion.   You have difficulty walking.   You have severe weakness.   You have vision problems.  If you fainted, call your local emergency services (911 in [place] Do not drive yourself to the hospital.   MAKE SURE YOU:   Understand these instructions.   Will watch your condition.   Will get help right away if you are not doing well or get worse.  Document Released: 10/01/2005 Document Revised: 04/01/2012 Document Reviewed: 11/30/2011  [employer] Patient Information 2014 [employer]

## 2013-11-04 NOTE — ED Notes (Signed)
Pt had a Heartrate of 91-94 and spo2 of 97-100% while ambulating around nursing and MD stations. Muscle spasms continued between neck and shoulder back area while ambulating. No sob and dizziness per Pt. RN Remi DeterSamuel informed of Pts ambulation.

## 2013-11-04 NOTE — ED Notes (Signed)
Pt c/o spasms in neck and shoulder  md notified

## 2014-07-28 ENCOUNTER — Other Ambulatory Visit: Payer: Self-pay

## 2014-07-29 LAB — CYTOLOGY - PAP
